# Patient Record
Sex: Male | Born: 1980 | Race: Black or African American | Hispanic: No | Marital: Single | Smoking: Current every day smoker
Health system: Southern US, Community
[De-identification: ages and names within clinical notes are randomized; demographics above are authoritative.]

---

## 2017-08-25 ENCOUNTER — Encounter (HOSPITAL_COMMUNITY): Payer: Self-pay | Admitting: Family Medicine

## 2017-08-25 DIAGNOSIS — F17290 Nicotine dependence, other tobacco product, uncomplicated: Secondary | ICD-10-CM | POA: Insufficient documentation

## 2017-08-25 DIAGNOSIS — R0981 Nasal congestion: Secondary | ICD-10-CM | POA: Insufficient documentation

## 2017-08-25 DIAGNOSIS — J029 Acute pharyngitis, unspecified: Secondary | ICD-10-CM | POA: Insufficient documentation

## 2017-08-25 MED ORDER — ACETAMINOPHEN 325 MG PO TABS
650.0000 mg | ORAL_TABLET | Freq: Once | ORAL | Status: AC | PRN
  Administered 2017-08-25: 650 mg via ORAL
  Filled 2017-08-25: qty 2

## 2017-08-25 NOTE — ED Triage Notes (Signed)
[facility] with complaints of scratchy throat starting this morning which continued to get worse throughout the day.  Pt states he feels a headache coming on but has not been nauseated or vomiting.  Febrile in route at 102.  Pain with swallowing.

## 2017-08-26 ENCOUNTER — Emergency Department (HOSPITAL_COMMUNITY)
Admission: EM | Admit: 2017-08-26 | Discharge: 2017-08-26 | Disposition: A | Discharge status: Home / Self Care | Payer: Self-pay | Attending: Emergency Medicine | Admitting: Emergency Medicine

## 2017-08-26 DIAGNOSIS — R69 Illness, unspecified: Secondary | ICD-10-CM

## 2017-08-26 DIAGNOSIS — J111 Influenza due to unidentified influenza virus with other respiratory manifestations: Secondary | ICD-10-CM

## 2017-08-26 MED ORDER — ALBUTEROL SULFATE (2.5 MG/3ML) 0.083% IN NEBU: 2.5000 mg | INHALATION_SOLUTION | Freq: Once | RESPIRATORY_TRACT | Status: DC

## 2017-08-26 MED ORDER — IBUPROFEN 200 MG PO TABS
600.0000 mg | ORAL_TABLET | Freq: Once | ORAL | Status: AC
  Administered 2017-08-26: 600 mg via ORAL
  Filled 2017-08-26: qty 3

## 2017-08-26 MED ORDER — ALBUTEROL SULFATE HFA 108 (90 BASE) MCG/ACT IN AERS: 2.0000 | INHALATION_SPRAY | RESPIRATORY_TRACT | 3 refills | Status: AC | PRN

## 2017-08-26 MED ORDER — FLUTICASONE PROPIONATE 50 MCG/ACT NA SUSP: 2.0000 | Freq: Every day | NASAL | 2 refills | Status: AC

## 2017-08-26 MED ORDER — ALBUTEROL SULFATE (2.5 MG/3ML) 0.083% IN NEBU: 5.0000 mg | INHALATION_SOLUTION | Freq: Once | RESPIRATORY_TRACT | Status: DC

## 2017-08-26 MED ORDER — AEROCHAMBER PLUS FLO-VU MEDIUM MISC
1.0000 | Freq: Once | Status: AC
  Administered 2017-08-26: 1
  Filled 2017-08-26: qty 1

## 2017-08-26 MED ORDER — BENZONATATE 100 MG PO CAPS: 100.0000 mg | ORAL_CAPSULE | Freq: Three times a day (TID) | ORAL | 0 refills | Status: AC

## 2017-08-26 MED ORDER — ALBUTEROL SULFATE HFA 108 (90 BASE) MCG/ACT IN AERS
2.0000 | INHALATION_SPRAY | Freq: Once | RESPIRATORY_TRACT | Status: AC
  Administered 2017-08-26: 2 via RESPIRATORY_TRACT
  Filled 2017-08-26: qty 6.7

## 2017-08-26 NOTE — ED Provider Notes (Signed)
[facility]-EMERGENCY DEPT Provider Note   CSN: [id number] Arrival date & time: 08/25/17  2211     History   Chief Complaint Chief Complaint  Patient presents with  . Flu Like Symptoms    HPI Tyler Shaw is a 37 y.o. male with no major medical hx presents to the Emergency Department complaining of gradual, persistent, progressively worsening URI symptoms onset yesterday morning. Associated symptoms include myalgias, cough, sore throat, chest tightness, nasal congestion, post nasal drip.  Pt reports he got a flu vaccine this year, but not last year.  Nothing makes it better and nothing makes it worse.  Pt reports taking cough medication without relief.  Pt reports he is a current smoker. Pt denies neck pain, neck stiffness, chest pain, shortness of breath, abdominal pain, nausea, vomiting, diarrhea, weakness, dizziness, syncope.  Pt reports he is a current smoker.  Patient denies recent travel, leg swelling, history of DVT, history of cancer or history of lupus.   The history is provided by the patient and medical records. No language interpreter was used.    History reviewed. No pertinent past medical history.  There are no active problems to display for this patient.   History reviewed. No pertinent surgical history.     Home Medications    Prior to Admission medications   Medication Sig Start Date End Date Taking? Authorizing Provider  albuterol (PROVENTIL HFA;VENTOLIN HFA) 108 (90 Base) MCG/ACT inhaler Inhale 2 puffs into the lungs every 6 (six) hours as needed for wheezing or shortness of breath.   Yes [provider]  naproxen sodium (ALEVE) 220 MG tablet Take 660 mg by mouth 2 (two) times daily as needed (Pain).   Yes [provider]  albuterol (PROVENTIL HFA;VENTOLIN HFA) 108 (90 Base) MCG/ACT inhaler Inhale 2 puffs into the lungs every 4 (four) hours as needed for wheezing or shortness of breath. 08/26/17   Georgianna Band, Dahlia Client,  PA-C  benzonatate (TESSALON) 100 MG capsule Take 1 capsule (100 mg total) by mouth every 8 (eight) hours. 08/26/17   Corlette Ciano, Dahlia Client, PA-C  fluticasone (FLONASE) 50 MCG/ACT nasal spray Place 2 sprays into both nostrils daily. 08/26/17   Zyona Pettaway, Boyd Kerbs    Family History History reviewed. No pertinent family history.  Social History Social History   Tobacco Use  . Smoking status: Current Every Day Smoker    Types: Cigars  . Smokeless tobacco: Never Used  Substance Use Topics  . Alcohol use: No    Frequency: Never  . Drug use: No     Allergies   Lidocaine   Review of Systems Review of Systems  Constitutional: Positive for chills, fatigue and fever ( Subjective). Negative for appetite change.  HENT: Positive for congestion, postnasal drip, rhinorrhea, sinus pressure and sore throat. Negative for ear discharge, ear pain and mouth sores.   Eyes: Negative for visual disturbance.  Respiratory: Positive for cough and chest tightness. Negative for shortness of breath, wheezing and stridor.   Cardiovascular: Negative for chest pain, palpitations and leg swelling.  Gastrointestinal: Negative for abdominal pain, diarrhea, nausea and vomiting.  Genitourinary: Negative for dysuria, frequency, hematuria and urgency.  Musculoskeletal: Positive for myalgias. Negative for arthralgias, back pain and neck stiffness.  Skin: Negative for rash.  Neurological: Positive for headaches ( Generalized, throbbing). Negative for syncope, light-headedness and numbness.  Hematological: Negative for adenopathy.  Psychiatric/Behavioral: The patient is not nervous/anxious.   All other systems reviewed and are negative.    Physical Exam Updated Vital Signs  BP 104/70 (BP Location: Left Arm)   Pulse 82   Temp 98.6 F (37 C) (Oral)   Resp 17   Ht 5\' 9"  (1.753 m)   Wt 85.3 kg (188 lb)   SpO2 97%   BMI 27.76 kg/m   Physical Exam  Constitutional: He appears well-developed and  well-nourished. No distress.  HENT:  Head: Normocephalic and atraumatic.  Right Ear: Tympanic membrane, external ear and ear canal normal.  Left Ear: Tympanic membrane, external ear and ear canal normal.  Nose: Mucosal edema and rhinorrhea present. No epistaxis. Right sinus exhibits no maxillary sinus tenderness and no frontal sinus tenderness. Left sinus exhibits no maxillary sinus tenderness and no frontal sinus tenderness.  Mouth/Throat: Uvula is midline and mucous membranes are normal. Mucous membranes are not pale and not cyanotic. No oropharyngeal exudate, posterior oropharyngeal edema, posterior oropharyngeal erythema or tonsillar abscesses.  Eyes: Conjunctivae are normal. Pupils are equal, round, and reactive to light.  Neck: Normal range of motion and full passive range of motion without pain.  Cardiovascular: Normal rate and intact distal pulses.  Pulmonary/Chest: Effort normal. No stridor. He has decreased breath sounds.  Diminished. but equal breath sounds without focal wheezes, rhonchi, rales  Abdominal: Soft. There is no tenderness.  Musculoskeletal: Normal range of motion.  Lymphadenopathy:    He has no cervical adenopathy.  Neurological: He is alert.  Skin: Skin is warm and dry. No rash noted. He is not diaphoretic.  Psychiatric: He has a normal mood and affect.  Nursing note and vitals reviewed.    ED Treatments / Results   Procedures Procedures (including critical care time)  Medications Ordered in ED Medications  AEROCHAMBER PLUS FLO-VU MEDIUM MISC 1 each (not administered)  ibuprofen (ADVIL,MOTRIN) tablet 600 mg (not administered)  albuterol (PROVENTIL HFA;VENTOLIN HFA) 108 (90 Base) MCG/ACT inhaler 2 puff (not administered)  acetaminophen (TYLENOL) tablet 650 mg (650 mg Oral Given 08/25/17 2343)     Initial Impression / Assessment and Plan / ED Course  I have reviewed the triage vital signs and the nursing notes.  Pertinent labs & imaging results that were  available during my care of the patient were reviewed by me and considered in my medical decision making (see chart for details).     Patient with symptoms consistent with influenza like illness.  Vitals are stable, afebrile on arrival.  Patient given acetaminophen with resolution of his fever.  No signs of dehydration, tolerating PO's.  Lungs are clear. Due to patient's presentation and physical exam a chest x-ray was not ordered bc likely diagnosis of flu.  Discussed the cost versus benefit of Tamiflu treatment with the patient.  Patient does not wish for prescription for Tamiflu.  Patient will be discharged with instructions to orally hydrate, rest, and use over-the-counter medications such as anti-inflammatories ibuprofen and Aleve for muscle aches and Tylenol for fever.  Patient will also be given a cough suppressant.  Just reasons to return immediately to the emergency department.  Patient states understanding and is in agreement with this plan.   Final Clinical Impressions(s) / ED Diagnoses   Final diagnoses:  Influenza-like illness    ED Discharge Orders        Ordered    fluticasone (FLONASE) 50 MCG/ACT nasal spray  Daily     08/26/17 0237    benzonatate (TESSALON) 100 MG capsule  Every 8 hours     08/26/17 0237    albuterol (PROVENTIL HFA;VENTOLIN HFA) 108 (90 Base) MCG/ACT inhaler  Every  4 hours PRN     08/26/17 0237       Jaelyn Cloninger, Boyd KerbsHannah, PA-C 08/26/17 0238    Zadie RhineWickline, Donald, MD 08/26/17 [phone]

## 2017-08-26 NOTE — Discharge Instructions (Signed)
1. Medications: Flonase, Tessalon, albuterol, alternate tylenol and ibuprofen for fever control, usual home medications 2. Treatment: rest, drink plenty of fluids,  3. Follow Up: Please followup with your primary doctor in 2-5 days for discussion of your diagnoses and further evaluation after today's visit; if you do not have a primary care doctor use the resource guide provided to find one; Please return to the ER for syncope, difficulty breathing, persistent high fevers, intractable vomiting or other concerns

## 2017-11-09 ENCOUNTER — Emergency Department (HOSPITAL_COMMUNITY): Payer: Self-pay

## 2017-11-09 ENCOUNTER — Encounter (HOSPITAL_COMMUNITY): Payer: Self-pay | Admitting: *Deleted

## 2017-11-09 ENCOUNTER — Emergency Department (HOSPITAL_COMMUNITY)
Admission: EM | Admit: 2017-11-09 | Discharge: 2017-11-09 | Disposition: A | Discharge status: Home / Self Care | Payer: Self-pay | Attending: Emergency Medicine | Admitting: Emergency Medicine

## 2017-11-09 ENCOUNTER — Other Ambulatory Visit: Payer: Self-pay

## 2017-11-09 DIAGNOSIS — R1031 Right lower quadrant pain: Secondary | ICD-10-CM | POA: Insufficient documentation

## 2017-11-09 DIAGNOSIS — F17290 Nicotine dependence, other tobacco product, uncomplicated: Secondary | ICD-10-CM | POA: Insufficient documentation

## 2017-11-09 LAB — COMPREHENSIVE METABOLIC PANEL
ALK PHOS: 52 U/L (ref 38–126)
ALT: 37 U/L (ref 17–63)
ANION GAP: 7 (ref 5–15)
AST: 44 U/L — ABNORMAL HIGH (ref 15–41)
Albumin: 3.7 g/dL (ref 3.5–5.0)
BILIRUBIN TOTAL: 0.4 mg/dL (ref 0.3–1.2)
BUN: 12 mg/dL (ref 6–20)
CALCIUM: 8.8 mg/dL — AB (ref 8.9–10.3)
CO2: 27 mmol/L (ref 22–32)
Chloride: 105 mmol/L (ref 101–111)
Creatinine, Ser: 1.23 mg/dL (ref 0.61–1.24)
Glucose, Bld: 95 mg/dL (ref 65–99)
POTASSIUM: 3.8 mmol/L (ref 3.5–5.1)
Sodium: 139 mmol/L (ref 135–145)
Total Protein: 6.6 g/dL (ref 6.5–8.1)

## 2017-11-09 LAB — URINALYSIS, ROUTINE W REFLEX MICROSCOPIC
BILIRUBIN URINE: NEGATIVE
Glucose, UA: NEGATIVE mg/dL
Hgb urine dipstick: NEGATIVE
Ketones, ur: NEGATIVE mg/dL
LEUKOCYTES UA: NEGATIVE
NITRITE: NEGATIVE
PH: 8 (ref 5.0–8.0)
Protein, ur: NEGATIVE mg/dL
SPECIFIC GRAVITY, URINE: 1.009 (ref 1.005–1.030)

## 2017-11-09 LAB — CBC
HEMATOCRIT: 43.3 % (ref 39.0–52.0)
HEMOGLOBIN: 14.2 g/dL (ref 13.0–17.0)
MCH: 27 pg (ref 26.0–34.0)
MCHC: 32.8 g/dL (ref 30.0–36.0)
MCV: 82.5 fL (ref 78.0–100.0)
Platelets: 172 10*3/uL (ref 150–400)
RBC: 5.25 MIL/uL (ref 4.22–5.81)
RDW: 15.5 % (ref 11.5–15.5)
WBC: 4.5 10*3/uL (ref 4.0–10.5)

## 2017-11-09 LAB — LIPASE, BLOOD: Lipase: 22 U/L (ref 11–51)

## 2017-11-09 MED ORDER — IBUPROFEN 800 MG PO TABS: 800.0000 mg | ORAL_TABLET | Freq: Three times a day (TID) | ORAL | 0 refills | Status: AC

## 2017-11-09 MED ORDER — IOPAMIDOL (ISOVUE-300) INJECTION 61%
INTRAVENOUS | Status: AC
  Administered 2017-11-09: 100 mL
  Filled 2017-11-09: qty 100

## 2017-11-09 MED ORDER — IBUPROFEN 800 MG PO TABS
800.0000 mg | ORAL_TABLET | Freq: Once | ORAL | Status: AC
  Administered 2017-11-09: 800 mg via ORAL
  Filled 2017-11-09: qty 1

## 2017-11-09 NOTE — ED Triage Notes (Signed)
Pt reports having groin pain that is radiating up to RLQ pain. Denies n/v/d or urinary symptoms.

## 2017-11-09 NOTE — Discharge Instructions (Signed)
Please read attached information. If you experience any new or worsening signs or symptoms please return to the emergency room for evaluation. Please follow-up with your primary care provider or specialist as discussed. Please use medication prescribed only as directed and discontinue taking if you have any concerning signs or symptoms.   °

## 2017-11-09 NOTE — ED Provider Notes (Signed)
[facility] EMERGENCY DEPARTMENT Provider Note   CSN: [id number] Arrival date & time: 11/09/17  0930     History   Chief Complaint Chief Complaint  Patient presents with  . Abdominal Pain  . Groin Pain    HPI Tyler Shaw is a 37 y.o. male.  HPI   37 year old male presents today with complaints of right lower quadrant abdominal pain.  Patient notes symptoms started approximately 5 days ago with pain in his right lower abdomen.  He notes this is worse when lifting.  He reports he lifts heavy objects on a regular basis, but notes since that time he has not been doing any heavy lifting.  He notes the pain has persisted.  He denies any upper abdominal pain nausea vomiting fever, denies any changes to his urine or bowel habits.  Patient denies any testicular pain swelling or abnormalities.   History reviewed. No pertinent past medical history.  There are no active problems to display for this patient.   History reviewed. No pertinent surgical history.     Home Medications    Prior to Admission medications   Medication Sig Start Date End Date Taking? Authorizing Provider  albuterol (PROVENTIL HFA;VENTOLIN HFA) 108 (90 Base) MCG/ACT inhaler Inhale 2 puffs into the lungs every 6 (six) hours as needed for wheezing or shortness of breath.    [provider]  albuterol (PROVENTIL HFA;VENTOLIN HFA) 108 (90 Base) MCG/ACT inhaler Inhale 2 puffs into the lungs every 4 (four) hours as needed for wheezing or shortness of breath. 08/26/17   Muthersbaugh, Dahlia Client, PA-C  benzonatate (TESSALON) 100 MG capsule Take 1 capsule (100 mg total) by mouth every 8 (eight) hours. 08/26/17   Muthersbaugh, Dahlia Client, PA-C  fluticasone (FLONASE) 50 MCG/ACT nasal spray Place 2 sprays into both nostrils daily. 08/26/17   Muthersbaugh, Dahlia Client, PA-C  ibuprofen (ADVIL,MOTRIN) 800 MG tablet Take 1 tablet (800 mg total) by mouth 3 (three) times daily. 11/09/17   Memorie Yokoyama, Tinnie Gens, PA-C  naproxen  sodium (ALEVE) 220 MG tablet Take 660 mg by mouth 2 (two) times daily as needed (Pain).    [provider]    Family History History reviewed. No pertinent family history.  Social History Social History   Tobacco Use  . Smoking status: Current Every Day Smoker    Types: Cigars  . Smokeless tobacco: Never Used  Substance Use Topics  . Alcohol use: No    Frequency: Never  . Drug use: No     Allergies   Lidocaine   Review of Systems Review of Systems  All other systems reviewed and are negative.    Physical Exam Updated Vital Signs BP 116/75 (BP Location: Right Arm)   Pulse 77   Temp 99.6 F (37.6 C) (Oral)   Resp 18   Ht 5\' 9"  (1.753 m)   Wt 84.8 kg (187 lb)   SpO2 100%   BMI 27.62 kg/m   Physical Exam  Constitutional: He is oriented to person, place, and time. He appears well-developed and well-nourished.  HENT:  Head: Normocephalic and atraumatic.  Eyes: Conjunctivae are normal. Pupils are equal, round, and reactive to light. Right eye exhibits no discharge. Left eye exhibits no discharge. No scleral icterus.  Neck: Normal range of motion. No JVD present. No tracheal deviation present.  Pulmonary/Chest: Effort normal. No stridor.  Abdominal:  Tenderness palpation right lower quadrant remainder abdominal exam without acute findings positive psoas sign  Neurological: He is alert and oriented to person, place, and  time. Coordination normal.  Psychiatric: He has a normal mood and affect. His behavior is normal. Judgment and thought content normal.  Nursing note and vitals reviewed.   ED Treatments / Results  Labs (all labs ordered are listed, but only abnormal results are displayed) Labs Reviewed  COMPREHENSIVE METABOLIC PANEL - Abnormal; Notable for the following components:      Result Value   Calcium 8.8 (*)    AST 44 (*)    All other components within normal limits  URINALYSIS, ROUTINE W REFLEX MICROSCOPIC - Abnormal; Notable for the  following components:   Color, Urine STRAW (*)    All other components within normal limits  LIPASE, BLOOD  CBC    EKG  EKG Interpretation None       Radiology Ct Abdomen Pelvis W Contrast  Result Date: 11/09/2017 CLINICAL DATA:  Lower abdomen pain for several days EXAM: CT ABDOMEN AND PELVIS WITH CONTRAST TECHNIQUE: Multidetector CT imaging of the abdomen and pelvis was performed using the standard protocol following bolus administration of intravenous contrast. CONTRAST:  100mL ISOVUE-300 IOPAMIDOL (ISOVUE-300) INJECTION 61% COMPARISON:  None. FINDINGS: Lower chest: The lung bases are clear. The heart is within normal limits in size. Hepatobiliary: The liver enhances with no focal abnormality and no ductal dilatation is seen. No calcified gallstones are noted. Pancreas: The pancreas is normal in size and the pancreatic duct is minimally prominent within normal limits. Spleen: The spleen is unremarkable. Adrenals/Urinary Tract: There do adrenal glands appear normal. The kidneys enhance with no calculus or mass, and no hydronephrosis is seen. The ureters are difficult to visualize but appear grossly normal in caliber to the bladder which is not well distended but no abnormality is evident. Stomach/Bowel: The stomach is only slightly distended with fluid. No gross abnormality is noted. No small bowel distention or edema is seen. The appendix is difficult to visualize in this patient with very little intraperitoneal fat, but no inflammatory process is seen within the right lower quadrant. There may be a tiny amount of free fluid layering within the pelvis. Vascular/Lymphatic: The abdominal aorta is normal in caliber. No adenopathy is seen. Reproductive: The prostate is normal in size. Other: None. Musculoskeletal: The lumbar vertebrae are in normal alignment with normal intervertebral disc spaces. IMPRESSION: 1. No significant abnormality is noted on CT of the abdomen pelvis. 2. The appendix is not  definitely visualized in this patient with very little peritoneal fat but no inflammatory process is noted. 3. No renal or ureteral calculi are seen. Electronically Signed   By: Dwyane DeePaul  Barry M.D.   On: 11/09/2017 13:44    Procedures Procedures (including critical care time)  Medications Ordered in ED Medications  ibuprofen (ADVIL,MOTRIN) tablet 800 mg (not administered)  iopamidol (ISOVUE-300) 61 % injection (100 mLs  Contrast Given 11/09/17 1322)     Initial Impression / Assessment and Plan / ED Course  I have reviewed the triage vital signs and the nursing notes.  Pertinent labs & imaging results that were available during my care of the patient were reviewed by me and considered in my medical decision making (see chart for details).     Final Clinical Impressions(s) / ED Diagnoses   Final diagnoses:  Right lower quadrant abdominal pain    Labs: Lipase, CMP, CBC, urinalysis  Imaging:  Consults:  Therapeutics: ibuprofen  Discharge Meds:  Ibuprofen   Assessment/Plan: 37 year old male presents today with complaints of abdominal pain.  This is likely muscular in nature.  He has reassuring  CT scan reassuring laboratory analysis.  Patient discharged with symptomatic care instructions strict return precautions.  He verbalized understanding and agreement to today's plan had no further questions or concerns.    ED Discharge Orders        Ordered    ibuprofen (ADVIL,MOTRIN) 800 MG tablet  3 times daily     11/09/17 1352       Eyvonne Mechanic, PA-C 11/09/17 1355    Melene Plan, DO 11/09/17 1439

## 2019-03-13 IMAGING — CT CT ABD-PELV W/ CM
2 of 4 series · 16 of 46 positions shown, 18 images · IV contrast (iopamidol)
Comparison: None.

CLINICAL DATA: Lower abdomen pain for several days

EXAM:
CT ABDOMEN AND PELVIS WITH CONTRAST
TECHNIQUE: Multidetector CT imaging of the abdomen and pelvis was performed
using the standard protocol following bolus administration of
intravenous contrast.
CONTRAST:  100mL [id number]-[id number] IOPAMIDOL ([id number]-AQQ) INJECTION 61%

[Series 3: abd/ pelvis 5.0 i30f 2 · axial · 0.76mm/px · z∈[+765,+1150]mm · 13 of 85 slices shown, 15 images]
[im 4/85  soft-tissue]
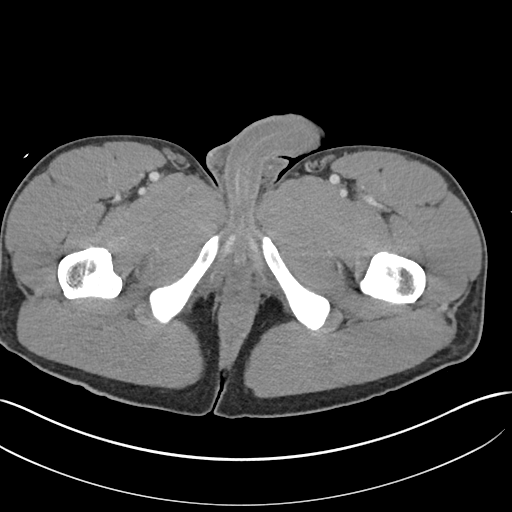
[im 4/85  bone]
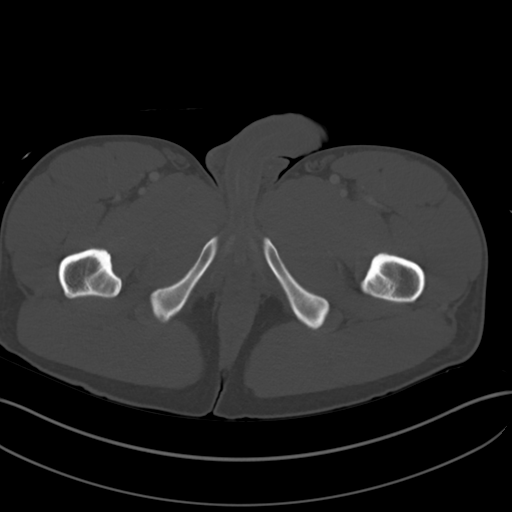
[im 11/85  soft-tissue]
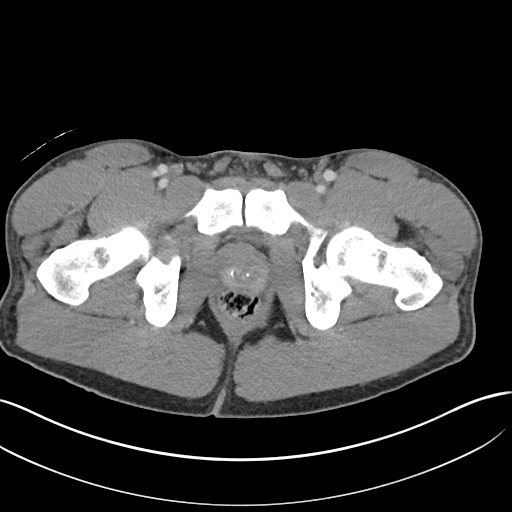
[im 18/85  soft-tissue]
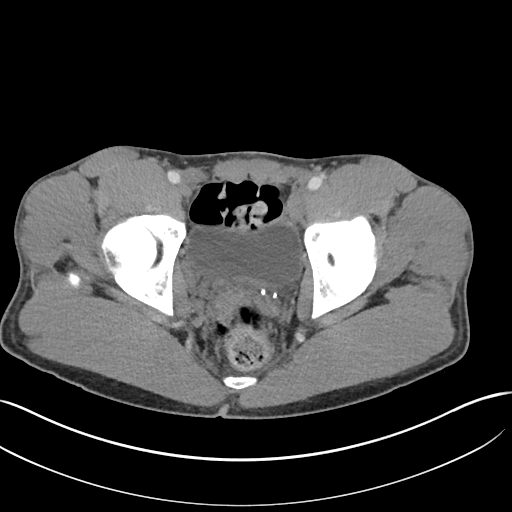
[im 25/85  soft-tissue]
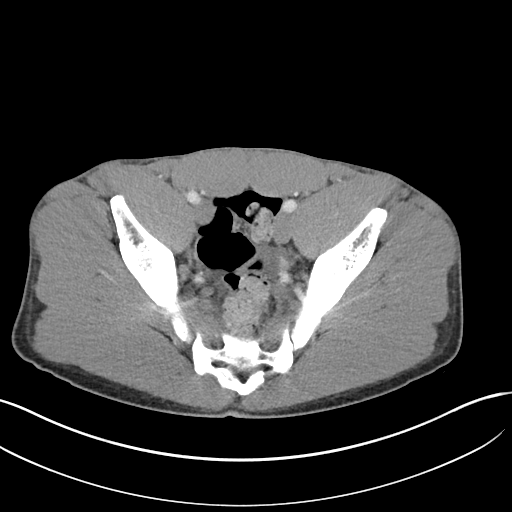
[im 29/85  soft-tissue]
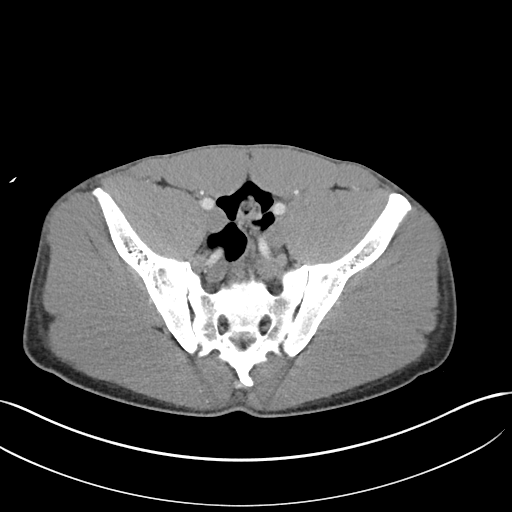
[im 36/85  soft-tissue]
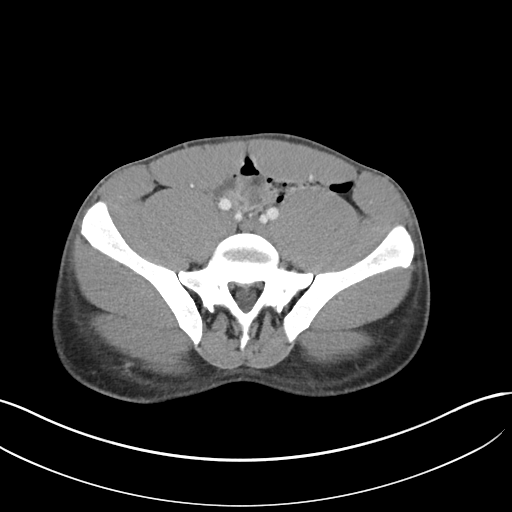
[im 43/85  soft-tissue]
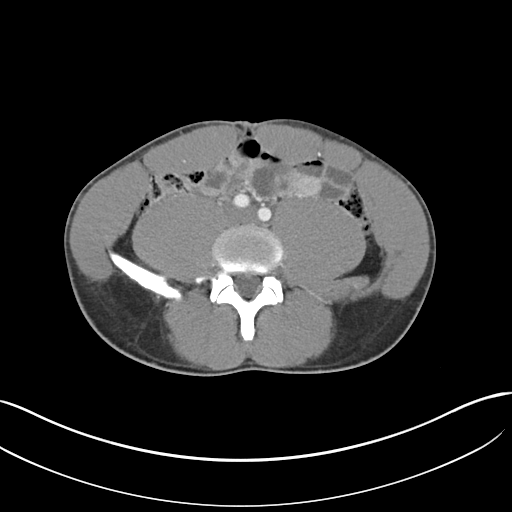
[im 50/85  soft-tissue]
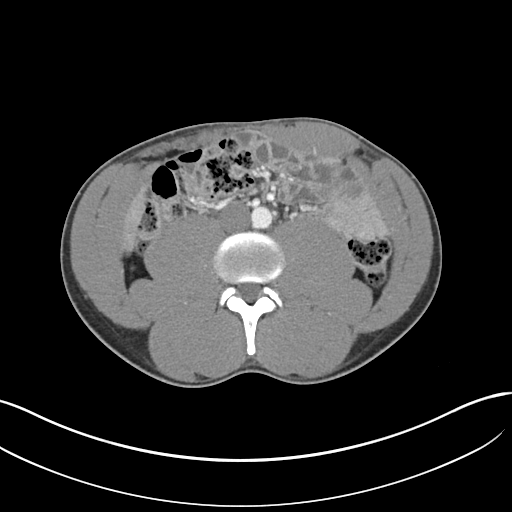
[im 57/85  soft-tissue]
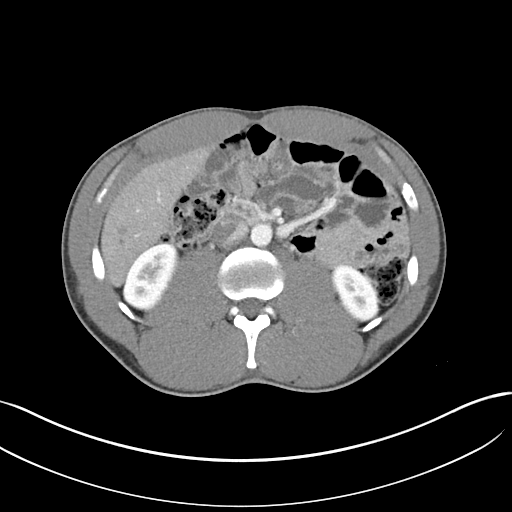
[im 57/85  bone]
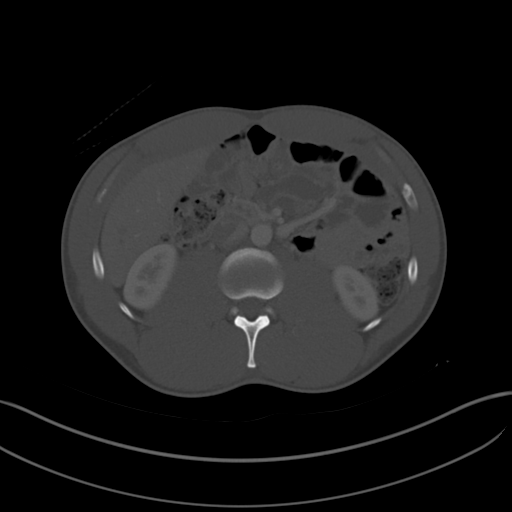
[im 60/85  soft-tissue]
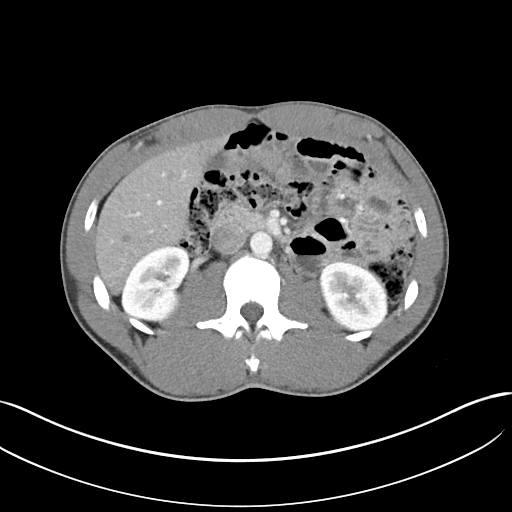
[im 67/85  soft-tissue]
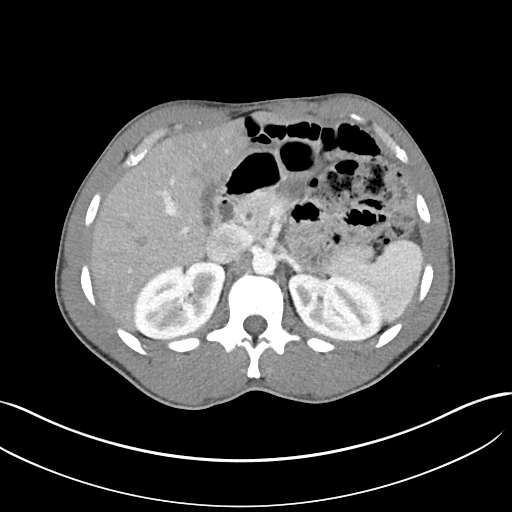
[im 74/85  soft-tissue]
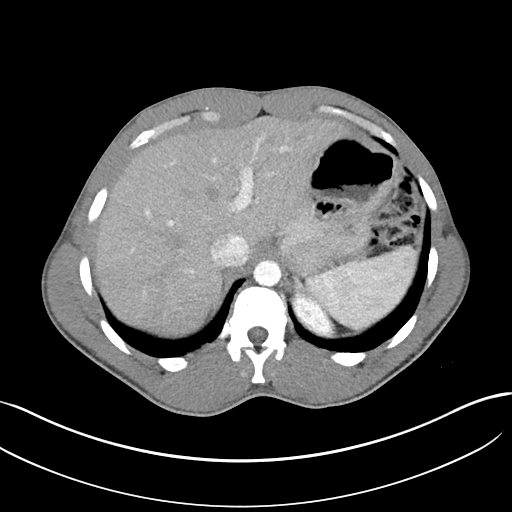
[im 81/85  soft-tissue]
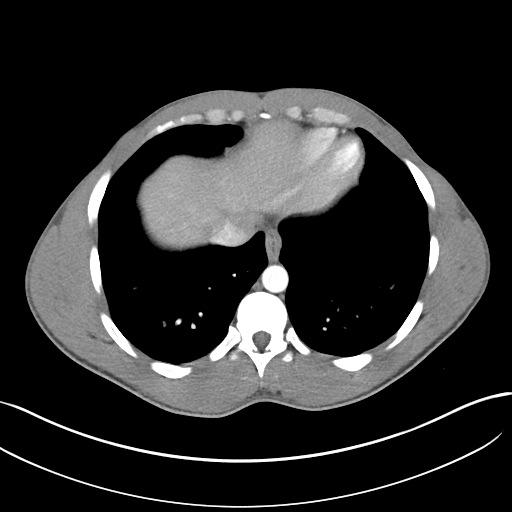

[Series 6: coronal soft tissue · coronal · 0.75mm/px · 3 of 77 slices shown]
[im 26/77  soft-tissue]
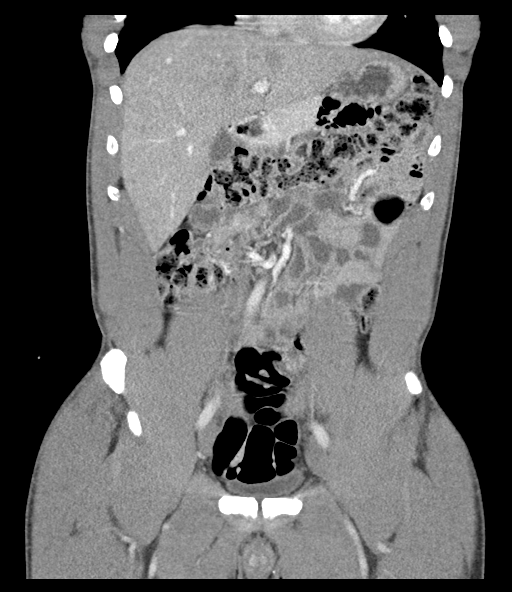
[im 34/77  soft-tissue]
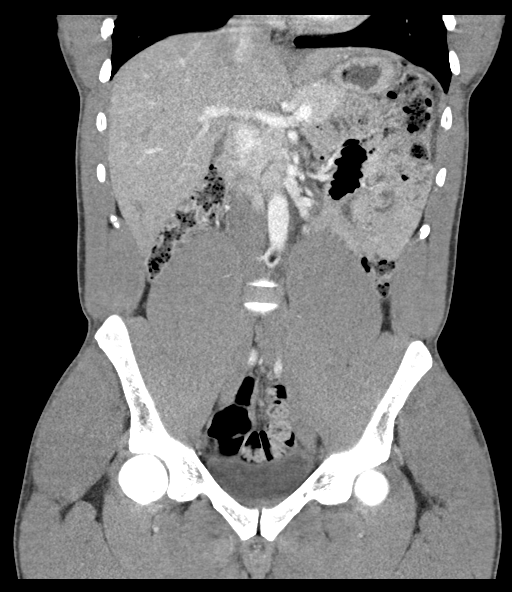
[im 43/77  soft-tissue]
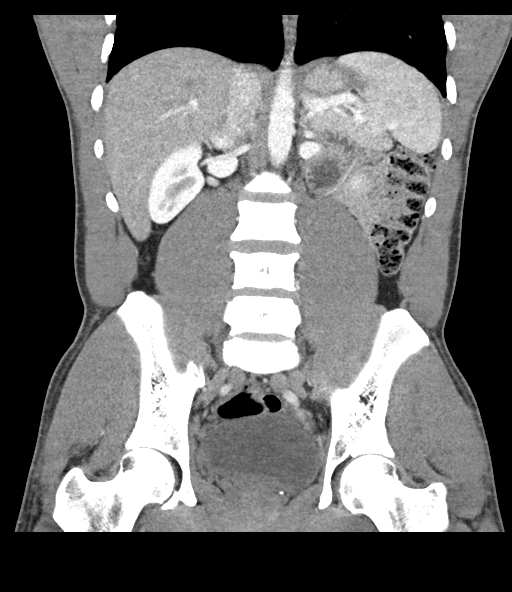

[16 of 46 positions shown; findings below may reference images not displayed]

FINDINGS: Lower chest: The lung bases are clear. The heart is within normal
limits in size.

Hepatobiliary: The liver enhances with no focal abnormality and no
ductal dilatation is seen. No calcified gallstones are noted.

Pancreas: The pancreas is normal in size and the pancreatic duct is
minimally prominent within normal limits.

Spleen: The spleen is unremarkable.

Adrenals/Urinary Tract: There do adrenal glands appear normal. The
kidneys enhance with no calculus or mass, and no hydronephrosis is
seen. The ureters are difficult to visualize but appear grossly
normal in caliber to the bladder which is not well distended but no
abnormality is evident.

Stomach/Bowel: The stomach is only slightly distended with fluid. No
gross abnormality is noted. No small bowel distention or edema is
seen. The appendix is difficult to visualize in this patient with
very little intraperitoneal fat, but no inflammatory process is seen
within the right lower quadrant. There may be a tiny amount of free
fluid layering within the pelvis.

Vascular/Lymphatic: The abdominal aorta is normal in caliber. No
adenopathy is seen.

Reproductive: The prostate is normal in size.

Other: None.

Musculoskeletal: The lumbar vertebrae are in normal alignment with
normal intervertebral disc spaces.
IMPRESSION: 1. No significant abnormality is noted on CT of the abdomen pelvis.
2. The appendix is not definitely visualized in this patient with
very little peritoneal fat but no inflammatory process is noted.
3. No renal or ureteral calculi are seen.
